# Patient Record
Sex: Male | Born: 1970 | Race: White | Hispanic: No | Marital: Married | Smoking: Current every day smoker
Health system: Southern US, Community
[De-identification: ages and names within clinical notes are randomized; demographics above are authoritative.]

---

## 2002-06-13 ENCOUNTER — Ambulatory Visit (HOSPITAL_COMMUNITY): Admission: RE | Admit: 2002-06-13 | Discharge: 2002-06-13 | Payer: Self-pay | Admitting: Internal Medicine

## 2019-04-04 ENCOUNTER — Other Ambulatory Visit (HOSPITAL_COMMUNITY): Payer: Self-pay | Admitting: Orthopedic Surgery

## 2019-04-04 ENCOUNTER — Other Ambulatory Visit: Payer: Self-pay | Admitting: Orthopedic Surgery

## 2019-04-04 DIAGNOSIS — G8929 Other chronic pain: Secondary | ICD-10-CM

## 2019-04-12 ENCOUNTER — Ambulatory Visit (HOSPITAL_COMMUNITY)
Admission: RE | Admit: 2019-04-12 | Discharge: 2019-04-12 | Disposition: A | Discharge status: Home / Self Care | Payer: No Typology Code available for payment source | Source: Ambulatory Visit | Attending: Orthopedic Surgery | Admitting: Orthopedic Surgery

## 2019-04-12 ENCOUNTER — Other Ambulatory Visit: Payer: Self-pay

## 2019-04-12 DIAGNOSIS — G8929 Other chronic pain: Secondary | ICD-10-CM | POA: Insufficient documentation

## 2019-04-12 DIAGNOSIS — M25512 Pain in left shoulder: Secondary | ICD-10-CM | POA: Insufficient documentation

## 2020-12-22 DIAGNOSIS — H524 Presbyopia: Secondary | ICD-10-CM | POA: Diagnosis not present

## 2021-01-04 IMAGING — MR MRI OF THE LEFT SHOULDER WITHOUT CONTRAST
5 series · 40 of 40 positions shown · non-contrast
Comparison: Radiographs 11/22/2018.

CLINICAL DATA: Chronic left shoulder pain for 3 years, especially
with overhead use. Question rotator cuff tear.

EXAM:
MRI OF THE LEFT SHOULDER WITHOUT CONTRAST
TECHNIQUE: Multiplanar, multisequence MR imaging of the shoulder was performed.
No intravenous contrast was administered.

[Series 9: PD fat-sat · axial · left · 3.0mm · 0.55mm/px · z∈[-171,-75]mm · 8 of 30 slices shown (1 of 2)]
[im 1/30]
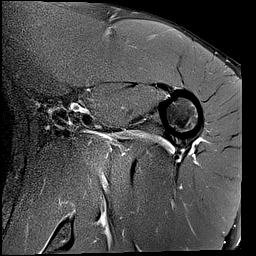
[im 5/30]
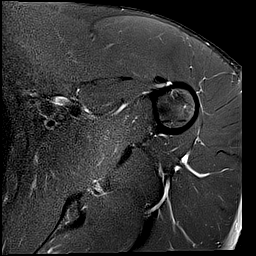
[im 9/30]
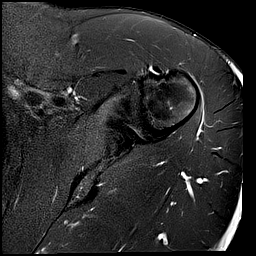
[im 13/30]
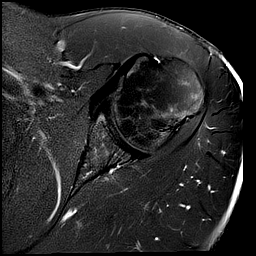
[im 17/30]
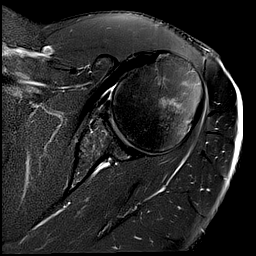
[im 21/30]
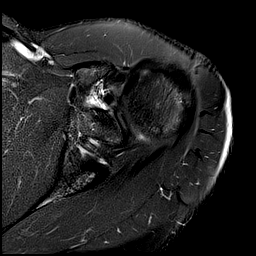
[im 25/30]
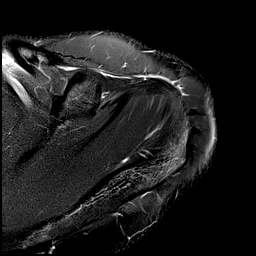
[im 30/30]
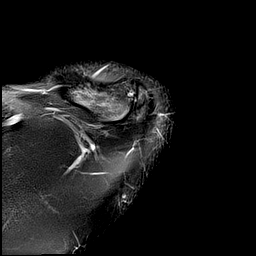

[Series 10: T2 fat-sat · oblique · left · 3.0mm · 0.46mm/px · 8 of 28 slices shown (1 of 2)]
[im 1/28]
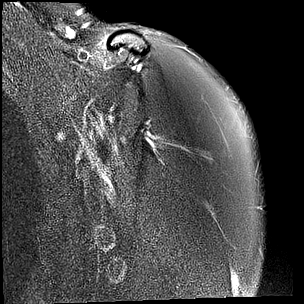
[im 4/28]
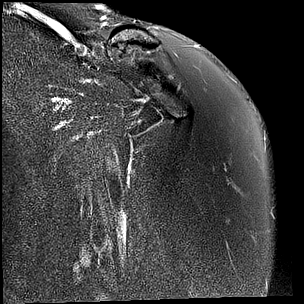
[im 8/28]
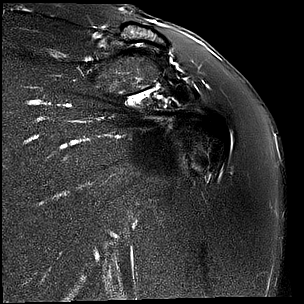
[im 12/28]
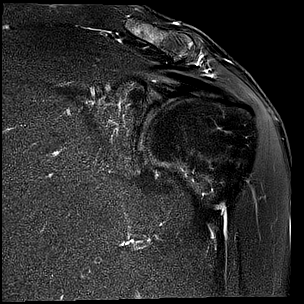
[im 16/28]
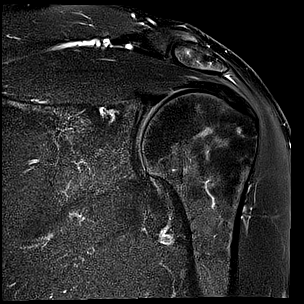
[im 20/28]
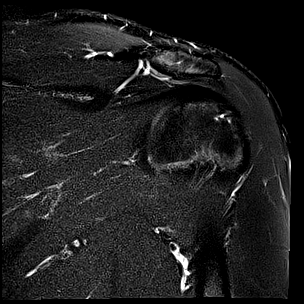
[im 24/28]
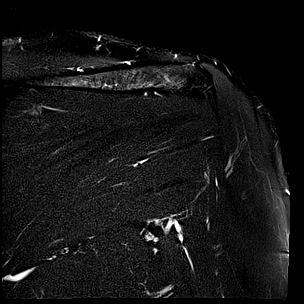
[im 28/28]
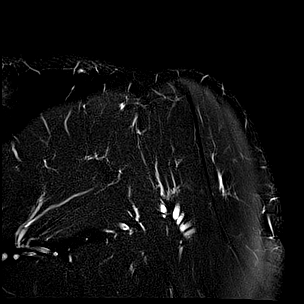

[Series 11: PD fat-sat · oblique · left · 3.0mm · 0.55mm/px · 8 of 28 slices shown (2 of 2)]
[im 1/28]
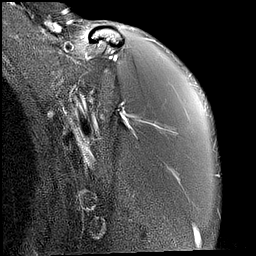
[im 4/28]
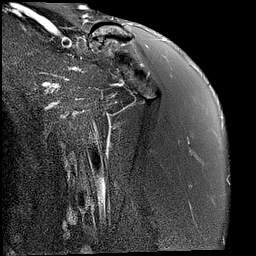
[im 8/28]
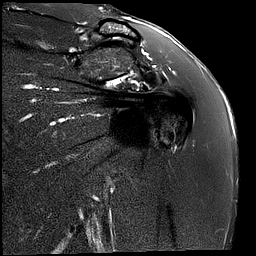
[im 12/28]
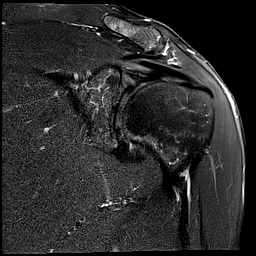
[im 16/28]
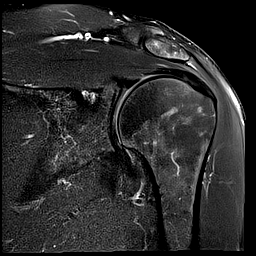
[im 20/28]
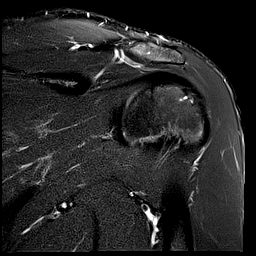
[im 24/28]
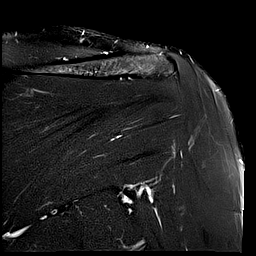
[im 28/28]
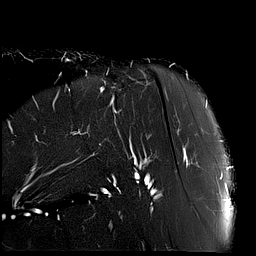

[Series 13: T1 · oblique · left · 3.0mm · 0.55mm/px · 8 of 30 slices shown]
[im 1/30]
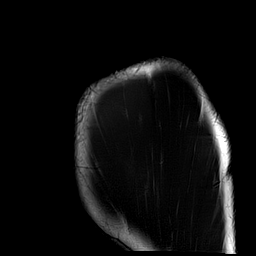
[im 5/30]
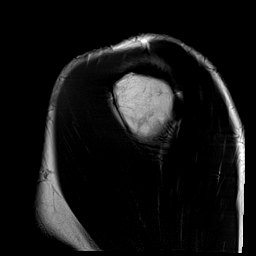
[im 9/30]
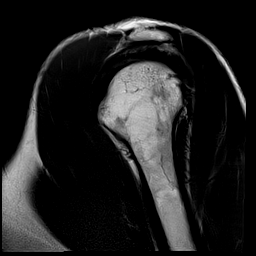
[im 13/30]
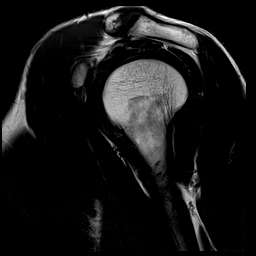
[im 17/30]
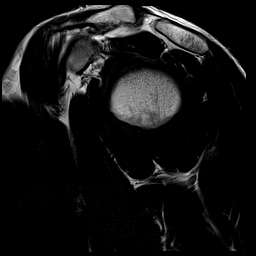
[im 21/30]
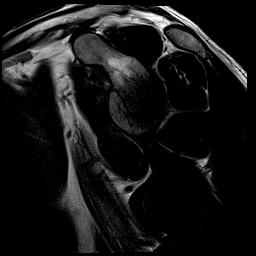
[im 25/30]
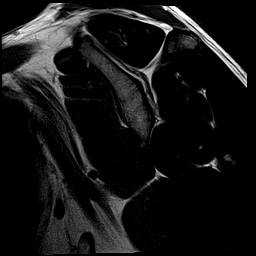
[im 30/30]
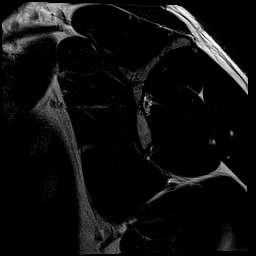

[Series 14: T2 fat-sat · oblique · left · 3.0mm · 0.44mm/px · 8 of 30 slices shown (2 of 2)]
[im 1/30]
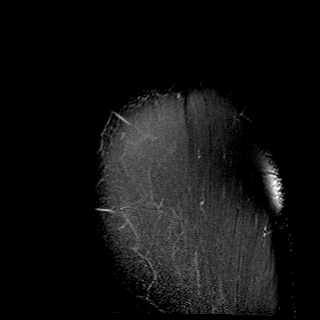
[im 5/30]
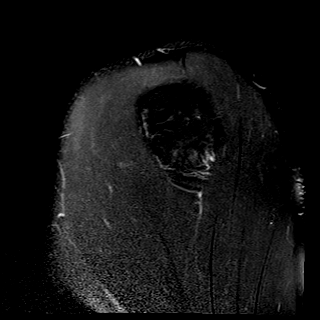
[im 9/30]
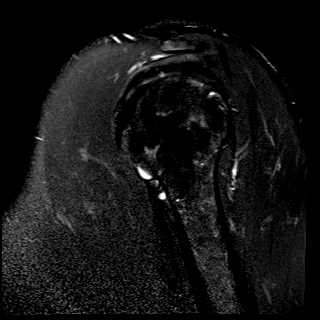
[im 13/30]
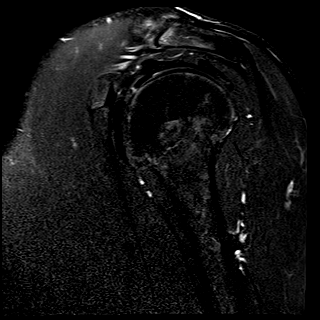
[im 17/30]
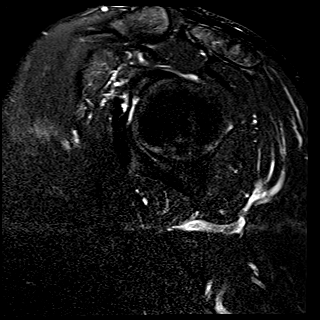
[im 21/30]
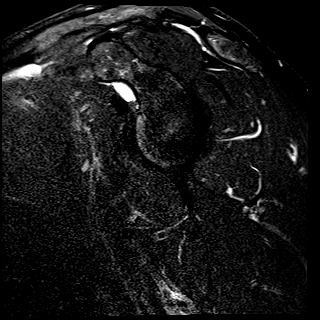
[im 25/30]
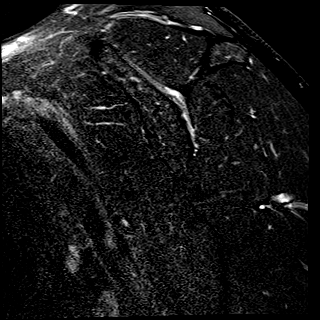
[im 30/30]
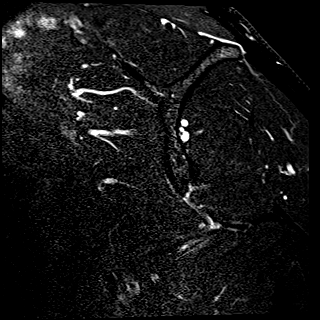

[40 of 40 positions shown; findings below may reference images not displayed]

FINDINGS: Rotator cuff: Mild supraspinatus tendinosis with minimal articular
surface irregularity anteriorly. No focal rotator cuff tear or
tendon retraction. The infraspinatus, subscapularis and teres minor
tendons appear normal.

Muscles:  No focal muscular atrophy or edema.

Biceps long head:  Intact and normally positioned.

Acromioclavicular Joint: The acromion is type 2. There are mild
acromioclavicular degenerative changes. No significant fluid is
present in the subacromial - subdeltoid bursa.

Glenohumeral Joint: No significant shoulder joint effusion or
glenohumeral arthropathy.

Labrum:  No evidence of labral tear or paralabral cyst.

Bones: No acute or significant extra-articular osseous findings.

Other: No significant soft tissue findings.
IMPRESSION: 1. Mild supraspinatus tendinosis with minimal articular surface
irregularity anteriorly. No focal rotator cuff tear, tendon
retraction or muscular atrophy.
2. The biceps tendon and labrum appear intact.
3. Mild acromioclavicular degenerative changes.

## 2021-04-13 ENCOUNTER — Ambulatory Visit: Payer: No Typology Code available for payment source | Admitting: Internal Medicine

## 2021-05-05 ENCOUNTER — Ambulatory Visit: Payer: Self-pay | Admitting: Internal Medicine

## 2021-06-08 ENCOUNTER — Ambulatory Visit (INDEPENDENT_AMBULATORY_CARE_PROVIDER_SITE_OTHER): Payer: Self-pay | Admitting: Internal Medicine

## 2021-06-08 ENCOUNTER — Other Ambulatory Visit: Payer: Self-pay

## 2021-06-08 ENCOUNTER — Encounter: Payer: Self-pay | Admitting: Internal Medicine

## 2021-06-08 VITALS — BP 130/80 | HR 98 | Temp 98.7°F | Resp 18 | Ht 70.0 in | Wt 179.1 lb

## 2021-06-08 DIAGNOSIS — Z114 Encounter for screening for human immunodeficiency virus [HIV]: Secondary | ICD-10-CM

## 2021-06-08 DIAGNOSIS — Z0000 Encounter for general adult medical examination without abnormal findings: Secondary | ICD-10-CM

## 2021-06-08 DIAGNOSIS — Z7689 Persons encountering health services in other specified circumstances: Secondary | ICD-10-CM

## 2021-06-08 DIAGNOSIS — Z122 Encounter for screening for malignant neoplasm of respiratory organs: Secondary | ICD-10-CM

## 2021-06-08 DIAGNOSIS — Z720 Tobacco use: Secondary | ICD-10-CM

## 2021-06-08 DIAGNOSIS — Z1159 Encounter for screening for other viral diseases: Secondary | ICD-10-CM

## 2021-06-08 DIAGNOSIS — Z23 Encounter for immunization: Secondary | ICD-10-CM

## 2021-06-08 DIAGNOSIS — Z1211 Encounter for screening for malignant neoplasm of colon: Secondary | ICD-10-CM

## 2021-06-08 DIAGNOSIS — R030 Elevated blood-pressure reading, without diagnosis of hypertension: Secondary | ICD-10-CM | POA: Insufficient documentation

## 2021-06-08 NOTE — Assessment & Plan Note (Signed)
Annual exam as documented. Counseling done  re healthy lifestyle involving commitment to 150 minutes exercise per week, heart healthy diet, and attaining healthy weight.The importance of adequate sleep also discussed. Changes in health habits are decided on by the patient with goals and time frames  set for achieving them. Immunization and cancer screening needs are specifically addressed at this visit. 

## 2021-06-08 NOTE — Assessment & Plan Note (Signed)
BP Readings from Last 1 Encounters:  06/08/21 130/80   Advised DASH diet and moderate exercise/walking, at least 150 mins/week

## 2021-06-08 NOTE — Progress Notes (Signed)
New Patient Office Visit  Subjective:  Patient ID: Philip Blake, male    DOB: 1970-11-15  Age: 50 y.o. MRN: [id number]  CC:  Chief Complaint  Patient presents with   New Patient (Initial Visit)    New patient just establishing care     HPI Philip Blake is a 50 year old male with PMH of tobacco abuse who presents for establishing care and annual physical.  He has been doing well overall. His BP was elevated initially during the visit, but improved later. Patient denies headache, dizziness, chest pain, dyspnea or palpitations.  He smokes 1 pack/day for about 30 years. He tried Nicotine patch in the past, but continued to smoke along with it.  He has had 1 dose of J&J vaccine, and denies to take any more COVID vaccine.  He received first dose of Shingrix vaccine.   History reviewed. No pertinent past medical history.  History reviewed. No pertinent surgical history.  History reviewed. No pertinent family history.  Social History   Socioeconomic History   Marital status: Married    Spouse name: Not on file   Number of children: Not on file   Years of education: Not on file   Highest education level: Not on file  Occupational History   Not on file  Tobacco Use   Smoking status: Every Day    Types: Cigarettes    Passive exposure: Current   Smokeless tobacco: Never  Substance and Sexual Activity   Alcohol use: Yes    Alcohol/week: 6.0 standard drinks    Types: 6 Cans of beer per week    Comment: 6 per day   Drug use: Never   Sexual activity: Not on file  Other Topics Concern   Not on file  Social History Narrative   Not on file   Social Determinants of Health   Financial Resource Strain: Not on file  Food Insecurity: Not on file  Transportation Needs: Not on file  Physical Activity: Not on file  Stress: Not on file  Social Connections: Not on file  Intimate Partner Violence: Not on file    ROS Review of Systems  Constitutional:  Negative for chills and  fever.  HENT:  Negative for congestion and sore throat.   Eyes:  Negative for pain and discharge.  Respiratory:  Negative for cough and shortness of breath.   Cardiovascular:  Negative for chest pain and palpitations.  Gastrointestinal:  Negative for constipation, diarrhea, nausea and vomiting.  Endocrine: Negative for polydipsia and polyuria.  Genitourinary:  Negative for dysuria and hematuria.  Musculoskeletal:  Negative for neck pain and neck stiffness.  Skin:  Negative for rash.  Neurological:  Negative for dizziness, weakness, numbness and headaches.  Psychiatric/Behavioral:  Negative for agitation and behavioral problems.    Objective:   Today's Vitals: BP 130/80 (BP Location: Left Arm, Cuff Size: Normal)   Pulse 98   Temp 98.7 F (37.1 C) (Oral)   Resp 18   Ht _0  (1.778 m)   Wt 179 lb 1.9 oz (81.2 kg)   SpO2 98%   BMI 25.70 kg/m   Physical Exam Vitals reviewed.  Constitutional:      General: He is not in acute distress.    Appearance: He is not diaphoretic.  HENT:     Head: Normocephalic and atraumatic.     Nose: Nose normal.     Mouth/Throat:     Mouth: Mucous membranes are moist.  Eyes:     General:  No scleral icterus.    Extraocular Movements: Extraocular movements intact.  Cardiovascular:     Rate and Rhythm: Normal rate and regular rhythm.     Pulses: Normal pulses.     Heart sounds: Normal heart sounds. No murmur heard. Pulmonary:     Breath sounds: Normal breath sounds. No wheezing or rales.  Abdominal:     Palpations: Abdomen is soft.     Tenderness: There is no abdominal tenderness.  Musculoskeletal:     Cervical back: Neck supple. No tenderness.     Right lower leg: No edema.     Left lower leg: No edema.  Skin:    General: Skin is warm.     Findings: No rash.  Neurological:     General: No focal deficit present.     Mental Status: He is alert and oriented to person, place, and time.     Sensory: No sensory deficit.     Motor: No  weakness.  Psychiatric:        Mood and Affect: Mood normal.        Behavior: Behavior normal.    Assessment & Plan:   Problem List Items Addressed This Visit       Other   Annual physical exam - Primary    Annual exam as documented. Counseling done  re healthy lifestyle involving commitment to 150 minutes exercise per week, heart healthy diet, and attaining healthy weight.The importance of adequate sleep also discussed. Changes in health habits are decided on by the patient with goals and time frames  set for achieving them. Immunization and cancer screening needs are specifically addressed at this visit.      Relevant Orders   CBC with Differential/Platelet   CMP14+EGFR   Lipid panel   HgB A1c   TSH   Vitamin D (25 hydroxy)   Tobacco abuse    Smokes about 1 pack/day  Asked about quitting: confirms that he currently smokes cigarettes Advise to quit smoking: Educated about QUITTING to reduce the risk of cancer, cardio and cerebrovascular disease. Assess willingness: Unwilling to quit at this time, but is working on cutting back. Assist with counseling and pharmacotherapy: Counseled for 5 minutes and literature provided. Advised to use Nicotine patch. Arrange for follow up: follow up in 3 months and continue to offer help.      Prehypertension    BP Readings from Last 1 Encounters:  06/08/21 130/80  Advised DASH diet and moderate exercise/walking, at least 150 mins/week       Other Visit Diagnoses     Screen for colon cancer       Relevant Orders   Ambulatory referral to Gastroenterology   Screening for lung cancer       Relevant Orders   CT CHEST LUNG CA SCREEN LOW DOSE W/O CM   Need for hepatitis C screening test       Relevant Orders   Hepatitis C Antibody   Encounter for screening for HIV       Relevant Orders   HIV antibody (with reflex)   Need for viral immunization       Relevant Orders   Varicella-zoster vaccine IM (Shingrix) (Completed)        No outpatient encounter medications on file as of 06/08/2021.   No facility-administered encounter medications on file as of 06/08/2021.    Follow-up: Return in about 4 months (around 10/08/2021) for Prehypertension and second dose of Shingrix.   Lindell Spar, MD

## 2021-06-08 NOTE — Assessment & Plan Note (Signed)
Smokes about 1 pack/day  Asked about quitting: confirms that he currently smokes cigarettes Advise to quit smoking: Educated about QUITTING to reduce the risk of cancer, cardio and cerebrovascular disease. Assess willingness: Unwilling to quit at this time, but is working on cutting back. Assist with counseling and pharmacotherapy: Counseled for 5 minutes and literature provided. Advised to use Nicotine patch. Arrange for follow up: follow up in 3 months and continue to offer help.

## 2021-06-08 NOTE — Patient Instructions (Addendum)
Please get fasting blood tests in a week.  Please continue to follow heart healthy diet and perform moderate exercise/walking at least 150 mins/week.  You are being referred to GI for screening colonoscopy.  You are being scheduled for low dose CT chest.

## 2021-06-09 ENCOUNTER — Encounter (INDEPENDENT_AMBULATORY_CARE_PROVIDER_SITE_OTHER): Payer: Self-pay | Admitting: *Deleted

## 2021-06-18 DIAGNOSIS — Z20822 Contact with and (suspected) exposure to covid-19: Secondary | ICD-10-CM | POA: Diagnosis not present

## 2021-07-09 ENCOUNTER — Ambulatory Visit (HOSPITAL_COMMUNITY): Admission: RE | Admit: 2021-07-09 | Payer: 59 | Source: Ambulatory Visit

## 2021-07-23 DIAGNOSIS — Z0000 Encounter for general adult medical examination without abnormal findings: Secondary | ICD-10-CM | POA: Diagnosis not present

## 2021-07-23 DIAGNOSIS — Z7689 Persons encountering health services in other specified circumstances: Secondary | ICD-10-CM | POA: Diagnosis not present

## 2021-07-23 DIAGNOSIS — Z1159 Encounter for screening for other viral diseases: Secondary | ICD-10-CM | POA: Diagnosis not present

## 2021-07-23 DIAGNOSIS — Z114 Encounter for screening for human immunodeficiency virus [HIV]: Secondary | ICD-10-CM | POA: Diagnosis not present

## 2021-07-24 LAB — CBC WITH DIFFERENTIAL/PLATELET
Basophils Absolute: 0.1 10*3/uL (ref 0.0–0.2)
Basos: 1 %
EOS (ABSOLUTE): 0.2 10*3/uL (ref 0.0–0.4)
Eos: 2 %
Hematocrit: 42.8 % (ref 37.5–51.0)
Hemoglobin: 14.4 g/dL (ref 13.0–17.7)
Immature Grans (Abs): 0 10*3/uL (ref 0.0–0.1)
Immature Granulocytes: 0 %
Lymphocytes Absolute: 2.5 10*3/uL (ref 0.7–3.1)
Lymphs: 27 %
MCH: 30.8 pg (ref 26.6–33.0)
MCHC: 33.6 g/dL (ref 31.5–35.7)
MCV: 92 fL (ref 79–97)
Monocytes Absolute: 0.7 10*3/uL (ref 0.1–0.9)
Monocytes: 8 %
Neutrophils Absolute: 5.6 10*3/uL (ref 1.4–7.0)
Neutrophils: 62 %
Platelets: 285 10*3/uL (ref 150–450)
RBC: 4.68 x10E6/uL (ref 4.14–5.80)
RDW: 12.1 % (ref 11.6–15.4)
WBC: 9 10*3/uL (ref 3.4–10.8)

## 2021-07-24 LAB — CMP14+EGFR
ALT: 15 IU/L (ref 0–44)
AST: 20 IU/L (ref 0–40)
Albumin/Globulin Ratio: 2.6 — ABNORMAL HIGH (ref 1.2–2.2)
Albumin: 4.6 g/dL (ref 3.8–4.9)
Alkaline Phosphatase: 51 IU/L (ref 44–121)
BUN/Creatinine Ratio: 15 (ref 9–20)
BUN: 14 mg/dL (ref 6–24)
Bilirubin Total: 0.5 mg/dL (ref 0.0–1.2)
CO2: 22 mmol/L (ref 20–29)
Calcium: 9.6 mg/dL (ref 8.7–10.2)
Chloride: 104 mmol/L (ref 96–106)
Creatinine, Ser: 0.96 mg/dL (ref 0.76–1.27)
Globulin, Total: 1.8 g/dL (ref 1.5–4.5)
Glucose: 86 mg/dL (ref 70–99)
Potassium: 4.4 mmol/L (ref 3.5–5.2)
Sodium: 142 mmol/L (ref 134–144)
Total Protein: 6.4 g/dL (ref 6.0–8.5)
eGFR: 95 mL/min/{1.73_m2} (ref >59)

## 2021-07-24 LAB — HEPATITIS C ANTIBODY: Hep C Virus Ab: 0.2 s/co ratio (ref 0.0–0.9)

## 2021-07-24 LAB — LIPID PANEL
Chol/HDL Ratio: 1.7 ratio (ref 0.0–5.0)
Cholesterol, Total: 166 mg/dL (ref 100–199)
HDL: 97 mg/dL (ref >39)
LDL Chol Calc (NIH): 61 mg/dL (ref 0–99)
Triglycerides: 31 mg/dL (ref 0–149)
VLDL Cholesterol Cal: 8 mg/dL (ref 5–40)

## 2021-07-24 LAB — HEMOGLOBIN A1C
Est. average glucose Bld gHb Est-mCnc: 94 mg/dL
Hgb A1c MFr Bld: 4.9 % (ref 4.8–5.6)

## 2021-07-24 LAB — VITAMIN D 25 HYDROXY (VIT D DEFICIENCY, FRACTURES): Vit D, 25-Hydroxy: 97.5 ng/mL (ref 30.0–100.0)

## 2021-07-24 LAB — TSH: TSH: 0.954 u[IU]/mL (ref 0.450–4.500)

## 2021-07-24 LAB — HIV ANTIBODY (ROUTINE TESTING W REFLEX): HIV Screen 4th Generation wRfx: NONREACTIVE

## 2021-07-31 ENCOUNTER — Encounter: Payer: Self-pay | Admitting: *Deleted

## 2021-10-08 ENCOUNTER — Ambulatory Visit: Payer: Self-pay | Admitting: Internal Medicine

## 2022-02-22 ENCOUNTER — Encounter: Payer: Self-pay | Admitting: Internal Medicine

## 2022-02-22 ENCOUNTER — Ambulatory Visit: Payer: 59 | Admitting: Internal Medicine

## 2022-02-22 VITALS — BP 118/82 | HR 98 | Resp 18 | Ht 70.0 in | Wt 186.6 lb

## 2022-02-22 DIAGNOSIS — L723 Sebaceous cyst: Secondary | ICD-10-CM

## 2022-02-22 DIAGNOSIS — Z1211 Encounter for screening for malignant neoplasm of colon: Secondary | ICD-10-CM

## 2022-02-22 DIAGNOSIS — L821 Other seborrheic keratosis: Secondary | ICD-10-CM

## 2022-02-22 NOTE — Patient Instructions (Signed)
Please apply sunscreen during sun exposure. ?

## 2022-02-22 NOTE — Progress Notes (Signed)
? ?Acute Office Visit ? ?Subjective:  ? ? Patient ID: Philip Blake, male    DOB: [birth year], 51 y.o.   MRN: [id number] ? ?Chief Complaint  ?Patient presents with  ? Follow-up  ?  Follow up pt has spot on left side of face would like looked at has been there for about a month doesn't hurt or itch   ? ? ?HPI ?Patient is in today for c/o a skin spot on left side of his face, which he noticed about 2 months ago.  Denies any recent change in size or shape.  Denies any injury or insect bite.  Denies any itching or irritation in the area. ? ?History reviewed. No pertinent past medical history. ? ?History reviewed. No pertinent surgical history. ? ?History reviewed. No pertinent family history. ? ?Social History  ? ?Socioeconomic History  ? Marital status: Married  ?  Spouse name: Not on file  ? Number of children: Not on file  ? Years of education: Not on file  ? Highest education level: Not on file  ?Occupational History  ? Not on file  ?Tobacco Use  ? Smoking status: Every Day  ?  Types: Cigarettes  ?  Passive exposure: Current  ? Smokeless tobacco: Never  ?Substance and Sexual Activity  ? Alcohol use: Yes  ?  Alcohol/week: 6.0 standard drinks  ?  Types: 6 Cans of beer per week  ?  Comment: 6 per day  ? Drug use: Never  ? Sexual activity: Not on file  ?Other Topics Concern  ? Not on file  ?Social History Narrative  ? Not on file  ? ?Social Determinants of Health  ? ?Financial Resource Strain: Not on file  ?Food Insecurity: Not on file  ?Transportation Needs: Not on file  ?Physical Activity: Not on file  ?Stress: Not on file  ?Social Connections: Not on file  ?Intimate Partner Violence: Not on file  ? ? ?No outpatient medications prior to visit.  ? ?No facility-administered medications prior to visit.  ? ? ?Allergies  ?Allergen Reactions  ? Sulfa Antibiotics   ?  Makes him dizzy   ? ? ?Review of Systems  ?Constitutional:  Negative for chills and fever.  ?HENT:  Negative for congestion and sore throat.   ?Eyes:  Negative  for pain and discharge.  ?Respiratory:  Negative for cough and shortness of breath.   ?Cardiovascular:  Negative for chest pain and palpitations.  ?Gastrointestinal:  Negative for constipation, diarrhea, nausea and vomiting.  ?Endocrine: Negative for polydipsia and polyuria.  ?Genitourinary:  Negative for dysuria and hematuria.  ?Musculoskeletal:  Negative for neck pain and neck stiffness.  ?Skin:   ?     Bump over left side of face  ?Neurological:  Negative for dizziness, weakness, numbness and headaches.  ?Psychiatric/Behavioral:  Negative for agitation and behavioral problems.   ? ?   ?Objective:  ?  ?Physical Exam ?Constitutional:   ?   General: He is not in acute distress. ?   Appearance: He is not diaphoretic.  ?Cardiovascular:  ?   Pulses: Normal pulses.  ?   Heart sounds: Normal heart sounds. No murmur heard. ?Pulmonary:  ?   Breath sounds: Normal breath sounds. No wheezing or rales.  ?Skin: ?   Findings: Lesion (Brownish patch near left ear - likely seborrheic keratosis) present.  ?   Comments: Cystlike structure near left eye  ?Neurological:  ?   Mental Status: He is alert.  ? ? ?BP 118/82 (BP Location:  Right Arm, Patient Position: Sitting, Cuff Size: Normal)   Pulse 98   Resp 18   Ht 5\' 10"  (1.778 m)   Wt 186 lb 9.6 oz (84.6 kg)   SpO2 98%   BMI 26.77 kg/m?  ?Wt Readings from Last 3 Encounters:  ?02/22/22 186 lb 9.6 oz (84.6 kg)  ?06/08/21 179 lb 1.9 oz (81.2 kg)  ? ? ? ?   ?Assessment & Plan:  ? ?Problem List Items Addressed This Visit   ? ?Visit Diagnoses   ? ? Sebaceous cyst    -  Primary ?Appears to be sebaceous cyst near left eye -benign, reassured ? ?  ? ? Special screening for malignant neoplasms, colon      ? Relevant Orders  ? Cologuard  ? Seborrheic keratosis     ?Has brownish patch near the left ear over sideline of hair, likely seborrheic keratosis ?Offered dermatology referral -he prefers to wait for now  ? ?  ? ? ? ?No orders of the defined types were placed in this  encounter. ? ? ? ?Lindell Spar, MD ?

## 2022-02-22 NOTE — Addendum Note (Signed)
Addended byTrena Platt on: 02/22/2022 05:22 PM ? ? Modules accepted: Level of Service ? ?

## 2024-01-31 ENCOUNTER — Other Ambulatory Visit (INDEPENDENT_AMBULATORY_CARE_PROVIDER_SITE_OTHER): Payer: Self-pay

## 2024-01-31 ENCOUNTER — Ambulatory Visit: Admitting: Orthopedic Surgery

## 2024-01-31 VITALS — BP 163/98 | HR 93 | Ht 70.0 in | Wt 187.0 lb

## 2024-01-31 DIAGNOSIS — G5632 Lesion of radial nerve, left upper limb: Secondary | ICD-10-CM | POA: Diagnosis not present

## 2024-01-31 DIAGNOSIS — M25522 Pain in left elbow: Secondary | ICD-10-CM | POA: Diagnosis not present

## 2024-01-31 NOTE — Progress Notes (Unsigned)
 New Patient Visit  Assessment: JAHDIEL KROL is a 53 y.o. male with the following: 1. 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  Subjective:  Chief Complaint  Patient presents with   Elbow Pain    L arm pain inside of the arms for approx 2 yrs.     History of Present Illness: LEODAN BOLYARD is a 53 y.o. male who {Presentation:27320} for evaluation of    Review of Systems: No fevers or chills*** No numbness or tingling No chest pain No shortness of breath No bowel or bladder dysfunction No GI distress No headaches   Medical History:  No past medical history on file.  No past surgical history on file.  No family history on file. Social History   Tobacco Use   Smoking status: Every Day    Types: Cigarettes    Passive exposure: Current   Smokeless tobacco: Never  Substance Use Topics   Alcohol use: Yes    Alcohol/week: 6.0 standard drinks of alcohol    Types: 6 Cans of beer per week    Comment: 6 per day   Drug use: Never    Allergies  Allergen Reactions   Sulfa Antibiotics     Makes him dizzy     No outpatient medications have been marked as taking for the 01/31/24 encounter (Office Visit) with Oliver Barre, MD.    Objective: BP (!) 163/98   Pulse 93   Ht 5\' 10"  (1.778 m)   Wt 187 lb (84.8 kg)   BMI 26.83 kg/m   Physical Exam:  General: {General PE Findings:25791} Gait: {Gait:25792}    IMAGING: {XR Reviewed:24899}   New Medications:  No orders of the defined types were placed in this encounter.     Oliver Barre, MD  01/31/2024 4:16 PM

## 2024-01-31 NOTE — Patient Instructions (Addendum)
 We will discuss injections and update you regarding the referral

## 2024-02-01 ENCOUNTER — Encounter: Payer: Self-pay | Admitting: Orthopedic Surgery

## 2024-02-01 NOTE — Addendum Note (Signed)
 Addended by: Baird Kay on: 02/01/2024 08:30 AM   Modules accepted: Orders

## 2024-02-06 ENCOUNTER — Encounter: Payer: Self-pay | Admitting: Orthopedic Surgery

## 2024-02-24 ENCOUNTER — Ambulatory Visit: Admitting: Sports Medicine

## 2024-02-24 ENCOUNTER — Encounter: Payer: Self-pay | Admitting: Sports Medicine

## 2024-02-24 ENCOUNTER — Other Ambulatory Visit: Payer: Self-pay

## 2024-02-24 DIAGNOSIS — M79632 Pain in left forearm: Secondary | ICD-10-CM | POA: Diagnosis not present

## 2024-02-24 DIAGNOSIS — G5632 Lesion of radial nerve, left upper limb: Secondary | ICD-10-CM

## 2024-02-24 NOTE — Progress Notes (Signed)
 Patient says that he has had pain in the left arm for two years that has gotten progressively worse. He is right-handed, but does carry a lot in his left hand, especially for work. He says that it does not bother him at rest, but he is unable to do bicep curls due to pain. He says that even if he holds his phone up to his ear for awhile he will have pain when he extends his elbow. Patient denies any shooting pain, numbness, or tingling. He says his pain is pretty local; he never feels it elsewhere in the arm, or in the shoulder or wrist. He is not taking any medication or doing anything else to treat his pain. He says that he tried a strap but that made his pain worse.

## 2024-02-24 NOTE — Progress Notes (Signed)
 Philip Blake - 53 y.o. male MRN [id number]  Date of birth: 07/05/1971  Office Visit Note: Visit Date: 02/24/2024 PCP: Meldon Sport, MD Referred by: Tonita Frater, MD  Subjective: Chief Complaint  Patient presents with   Left Elbow - Pain   HPI: Philip Blake is a pleasant 53 y.o. male who presents today for chronic left forearm pain.  He has had pain deep within the proximal forearm for approximately 2 years.  His pain continues to worsen.  This is affecting his daily activities.  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  He reports his toolbox is about 40 pounds and he carries this with his left hand.  He denies any weakness in the forearm or wrist, but has a deep ache within the proximal forearm.  No numbness or tingling.  He has not had any treatment or surgical intervention for this.  Pertinent ROS were reviewed with the patient and found to be negative unless otherwise specified above in HPI.   Assessment & Plan: Visit Diagnoses:  1. 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 Left forearm pain    Plan: Impression is signs and symptoms as well as ultrasound findings indicative of radial tunnel syndrome with compression and flattening of the deep branch of the radial nerve.  Through shared decision making, we did proceed with an US -guided hydrodissection peripheral nerve injection around the deep branch of the radial nerve in this location near the entrapment around arcade of [place].  I would like him to avoid gripping and lifting activities for the left upper extremity for the next 48 to 72 hours.  Starting on Monday he may return to activity as tolerated.  I would like him to see over the next 2 weeks to what degree his symptoms have improved.  He will send a message or call me in 2 weeks and give me a percentage of his pain from 0-100% following relief from this injection.  We discussed  postinjection protocol.  He may use ice/heat as well as over-the-counter anti-inflammatories as needed.  Depending on the improvement, we could consider additional hydrodissection nerve injection.  Surgical release is always an option as well.  Follow-up: Return for message or call me with update x 2 weeks.   Meds & Orders: No orders of the defined types were placed in this encounter.   Orders Placed This Encounter  Procedures   US  Extrem Up Left Ltd     Procedures:  US -guided Radial Tunnel injection, Left Arm After discussion on risks/benefits/indications, informed verbal consent was obtained. A timeout was then performed. The patient was positioned in the supine position with the arm laying flat on the examination table in a modified supinated position with the thumb facing upward.  The elbow and forearm was prepped with ChloraPrep and multiple alcohol swabs.  Utilizing ultrasound guidance with the probe in a short axis plane, the deep branch of the radial nerve was visualized at the area of restriction with the overlying supinator and exiting the arcade of [place]. Using ultrasound guidance, a 25-gauge, 1.5 inch needle was inserted into this area for anesthetic purposes with 4 cc of lidocaine 1% only. After appropriate analgesia, a separate 22-gauge 1.5 inch needle was inserted under ultrasound guidance using an in-plane, radial to ulnar approach with visualization of the needle tip going above and below the branch of the  radial nerve. A mixture of 2:2:3:2:1 cc's of lidocaine:bupivicaine:D50W:NS:methylprednisolone was then subsequently injected with hydrodissection technique in a 360-degree fashion around the deep branch of the radial nerve. Patient tolerated procedure well without immediate complication.  A Band-Aid was applied.      Clinical History: No specialty comments available.  He reports that he has been smoking cigarettes. He has been exposed to tobacco smoke. He has never used  smokeless tobacco. No results for input(s): "HGBA1C", "LABURIC" in the last 8760 hours.  Objective:    Physical Exam  Gen: Well-appearing, in no acute distress; non-toxic CV: Well-perfused. Warm.  Resp: Breathing unlabored on room air; no wheezing. Psych: Fluid speech in conversation; appropriate affect; normal thought process  Ortho Exam - Left elbow/forearm: There is full range of motion about the elbow joint with flexion and tension.  No elbow joint effusion.  There is pain with deep palpation over the dorsal proximal forearm musculature near the overlapping supinator.  There is no weakness with resisted forearm extension or grip strength.  Imaging: US  Extrem Up Left Ltd Result Date: 02/24/2024 Limited musculoskeletal ultrasound of the left elbow/arm was performed today.  The elbow joint was visualized without effusion, no abnormality of the coronoid.  The radial nerve was identified both in a static and dynamic view from the level of the flexor elbow crease to the mid forearm.  The radial nerve was followed as it split both into the deep and superficial branches.  The deep branch was followed dynamically as it coursed through the arcade of [place] with the overlying supinator muscle identified.  Active and just proximal to this location the nerve is well-circumscribed but scanning distally there is compression with flattening of the deep branch of the radial nerve in this location, consistent with findings of radial tunnel syndrome. *Procedurally successful radial tunnel injection with Hydro dissection injection surrounding the deep branch of the radial nerve        Previous elbow XR 01/31/24:  Narrative & Impression  X-rays left elbow were obtained in clinic today.  No acute injuries noted.  No dislocation.  No degenerative changes.  No evidence of a chronic injury.  No reactive bone formation.  No bony lesions.   Impression: Negative left elbow x-ray.   Past  Medical/Family/Surgical/Social History: Medications & Allergies reviewed per EMR, new medications updated. Patient Active Problem List   Diagnosis Date Noted   Annual physical exam 06/08/2021   Tobacco abuse 06/08/2021   Prehypertension 06/08/2021   No past medical history on file. No family history on file. No past surgical history on file. Social History   Occupational History   Not on file  Tobacco Use   Smoking status: Every Day    Types: Cigarettes    Passive exposure: Current   Smokeless tobacco: Never  Substance and Sexual Activity   Alcohol use: Yes    Alcohol/week: 6.0 standard drinks of alcohol    Types: 6 Cans of beer per week    Comment: 6 per day   Drug use: Never   Sexual activity: Not on file

## 2024-03-10 ENCOUNTER — Encounter: Payer: Self-pay | Admitting: Sports Medicine

## 2024-04-13 ENCOUNTER — Ambulatory Visit: Admitting: Sports Medicine

## 2024-04-20 ENCOUNTER — Ambulatory Visit: Admitting: Sports Medicine

## 2024-05-03 ENCOUNTER — Encounter: Payer: Self-pay | Admitting: Sports Medicine

## 2024-05-03 ENCOUNTER — Ambulatory Visit: Admitting: Sports Medicine

## 2024-05-03 ENCOUNTER — Other Ambulatory Visit: Payer: Self-pay

## 2024-05-03 DIAGNOSIS — G5632 Lesion of radial nerve, left upper limb: Secondary | ICD-10-CM | POA: Diagnosis not present

## 2024-05-03 DIAGNOSIS — M79632 Pain in left forearm: Secondary | ICD-10-CM | POA: Diagnosis not present

## 2024-05-03 NOTE — Progress Notes (Signed)
 Philip Blake - 53 y.o. male MRN [id number]  Date of birth: 17-Aug-1971  Office Visit Note: Visit Date: 05/03/2024 PCP: Tobie Suzzane POUR, MD Referred by: Tobie Suzzane POUR, MD  Subjective: Chief Complaint  Patient presents with   Left Elbow - Pain   HPI: Philip Blake is a pleasant 53 y.o. male who presents today for follow-up of chronic left elbow/forearm pain with evidence of radial tunnel syndrome.  He saw me back on 02/24/2024 and we did ultrasound the surrounding area identifying the deep branch of the radial nerve near the radial tunnel.  We did perform a Hydro dissection injection of the deep branch of the radial nerve which gave him about 40% improvement of his symptoms.  Since that time he has continued to improve maybe about 60% or so but is still having some pain deep within the forearm.  He has been able to flex the bicep and use the arm much more than in the past.  Pertinent ROS were reviewed with the patient and found to be negative unless otherwise specified above in HPI.   Assessment & Plan: Visit Diagnoses:  1. 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 Left forearm pain    Plan: Impression is improving but persistent radial tunnel syndrome of the left upper extremity with forearm pain.  He did receive at least 40% improvement from previous hydrodissection injection of the radial tunnel over 2 months ago and has had continued improvement with both pain reduction and range of motion following this.  Through shared decision making, we did proceed with 1 additional ultrasound-guided hydrodissection peripheral nerve injection around the deep branch of the radial nerve in this location near the entrapment around arcade of [place].  I would like him to avoid gripping, repetitive upper extremity activities and lifting activities of the left upper extremity for the next 2 to 3 days.  Following this he may return to work but would still avoid heavy gripping activities of the forearm  for the next week. He may use ice or over-the-counter anti-inflammatories only as needed.  Discussed with [name] I would like to see over the next 3-4 weeks his degree of improvement.  If he is not having further reduction of his pain, he may benefit from seeing our hand/arm OT, [name].  He will let me know in 1 month how he is doing. Otherwise f/u PRN.  Follow-up: Return if symptoms worsen or fail to improve.   Meds & Orders: No orders of the defined types were placed in this encounter.   Orders Placed This Encounter  Procedures   US  Guided Needle Placement - No Linked Charges     Procedures:  US -guided Radial Tunnel injection, Left Arm After discussion on risks/benefits/indications, informed verbal consent was obtained. A timeout was then performed. The patient was positioned in the supine position with the arm laying flat on the examination table in a modified supinated position with the thumb facing upward.  The elbow and forearm was prepped with ChloraPrep and multiple alcohol swabs.  Utilizing ultrasound guidance with the probe in a short axis plane, the deep branch of the radial nerve was visualized at the area of restriction with the overlying supinator and exiting the arcade of [place]. Using ultrasound guidance, a 25-gauge, 1.5 inch needle was inserted into this area for anesthetic purposes with 4 cc of lidocaine 1% only. After appropriate analgesia, a separate 22-gauge 1.5 inch needle was inserted under ultrasound guidance using an in-plane,  radial to ulnar approach with visualization of the needle tip going above and below the branch of the radial nerve. A mixture of 2:2:3:2:1 cc's of lidocaine:bupivicaine:D50W:NS:celestone was then subsequently injected with hydrodissection technique in a 360-degree fashion around the deep branch of the radial nerve. Patient tolerated procedure well without immediate complication.  A Band-Aid was applied.        Clinical History: No specialty  comments available.  He reports that he has been smoking cigarettes. He has been exposed to tobacco smoke. He has never used smokeless tobacco. No results for input(s): HGBA1C, LABURIC in the last 8760 hours.  Objective:    Physical Exam  Gen: Well-appearing, in no acute distress; non-toxic CV: Well-perfused. Warm.  Resp: Breathing unlabored on room air; no wheezing. Psych: Fluid speech in conversation; appropriate affect; normal thought process  Ortho Exam - Left elbow: No redness swelling or effusion.  Full range of motion with flexion and extension at the elbow joint.  Positive TTP with palpation and firm Tinel's testing right over the radial tunnel region near [place] [place].  There is well-defined muscle bulk of the forearm with no weakness of the forearm or wrist.  Imaging:    Past Medical/Family/Surgical/Social History: Medications & Allergies reviewed per EMR, new medications updated. Patient Active Problem List   Diagnosis Date Noted   Annual physical exam 06/08/2021   Tobacco abuse 06/08/2021   Prehypertension 06/08/2021   History reviewed. No pertinent past medical history. History reviewed. No pertinent family history. History reviewed. No pertinent surgical history. Social History   Occupational History   Not on file  Tobacco Use   Smoking status: Every Day    Types: Cigarettes    Passive exposure: Current   Smokeless tobacco: Never  Substance and Sexual Activity   Alcohol use: Yes    Alcohol/week: 6.0 standard drinks of alcohol    Types: 6 Cans of beer per week    Comment: 6 per day   Drug use: Never   Sexual activity: Not on file

## 2024-05-03 NOTE — Progress Notes (Signed)
 Patient says that he does feel a bit better. He says that initially he felt about 40% improved, but does think he has noticed improvements in the time since then. He has been able to flex his bicep and use the arm more than he could before. He is still having discomfort in the same location as he was before.

## 2024-08-27 ENCOUNTER — Encounter: Payer: Self-pay | Admitting: Radiology

## 2024-10-09 ENCOUNTER — Ambulatory Visit
Admission: EM | Admit: 2024-10-09 | Discharge: 2024-10-09 | Disposition: A | Discharge status: Home / Self Care | Attending: Nurse Practitioner | Admitting: Nurse Practitioner

## 2024-10-09 DIAGNOSIS — J069 Acute upper respiratory infection, unspecified: Secondary | ICD-10-CM

## 2024-10-09 LAB — POC COVID19/FLU A&B COMBO
Covid Antigen, POC: NEGATIVE
Influenza A Antigen, POC: NEGATIVE
Influenza B Antigen, POC: NEGATIVE

## 2024-10-09 MED ORDER — PROMETHAZINE-DM 6.25-15 MG/5ML PO SYRP: 5.0000 mL | ORAL_SOLUTION | Freq: Four times a day (QID) | ORAL | 0 refills | Status: AC | PRN

## 2024-10-09 NOTE — Discharge Instructions (Signed)
 The COVID/flu test was negative. Take medication as prescribed. As discussed, you may find it helpful to begin an antihistamine such as Claritin, Allegra, or Zyrtec daily. Increase fluids and allow for plenty of rest. You may take over-the-counter Tylenol as needed for pain, fever, or general discomfort. Recommend normal saline nasal spray throughout the day for nasal congestion or runny nose. For the cough, recommend the use of a humidifier in your bedroom at nighttime during sleep and sleeping elevated on pillows while symptoms persist. Try to stop smoking while symptoms persist. Symptoms should begin to improve over the next 5 to 7 days.  If symptoms fail to improve, or begin to worsen, you may follow-up in this clinic or with your primary care physician for further evaluation. Follow-up as needed.

## 2024-10-09 NOTE — ED Provider Notes (Signed)
 [facility]    CSN: [id number] Arrival date & time: 10/09/24  1050      History   Chief Complaint No chief complaint on file.   HPI Philip Blake is a 53 y.o. male.   The history is provided by the patient.   Patient presents with a 2-day history of cough and runny nose.  He denies fever, chills, headache, ear pain, wheezing, difficulty breathing, abdominal pain, nausea, vomiting, diarrhea, or rash.  Patient reports that he does have underlying history of smoking.  So far, states he has not taken any medications for his symptoms.  History reviewed. No pertinent past medical history.  Patient Active Problem List   Diagnosis Date Noted   Annual physical exam 06/08/2021   Tobacco abuse 06/08/2021   Prehypertension 06/08/2021    History reviewed. No pertinent surgical history.     Home Medications    Prior to Admission medications  Not on File    Family History History reviewed. No pertinent family history.  Social History Social History[1]   Allergies   Sulfa antibiotics   Review of Systems Review of Systems Per HPI  Physical Exam Triage Vital Signs ED Triage Vitals  Encounter Vitals Group     BP 10/09/24 1222 (!) 140/96     Girls Systolic BP Percentile --      Girls Diastolic BP Percentile --      Boys Systolic BP Percentile --      Boys Diastolic BP Percentile --      Pulse Rate 10/09/24 1222 (!) 114     Resp 10/09/24 1222 20     Temp 10/09/24 1222 98 F (36.7 C)     Temp Source 10/09/24 1222 Oral     SpO2 10/09/24 1222 95 %     Weight --      Height --      Head Circumference --      Peak Flow --      Pain Score 10/09/24 1221 0     Pain Loc --      Pain Education --      Exclude from Growth Chart --    No data found.  Updated Vital Signs BP (!) 140/96 (BP Location: Right Arm)   Pulse (!) 114   Temp 98 F (36.7 C) (Oral)   Resp 20   SpO2 95%   Visual Acuity Right Eye Distance:   Left Eye Distance:   Bilateral  Distance:    Right Eye Near:   Left Eye Near:    Bilateral Near:     Physical Exam Vitals and nursing note reviewed.  Constitutional:      General: He is not in acute distress.    Appearance: Normal appearance. He is well-developed.  HENT:     Head: Normocephalic and atraumatic.     Right Ear: Tympanic membrane, ear canal and external ear normal.     Left Ear: Tympanic membrane, ear canal and external ear normal.     Nose: Rhinorrhea present.     Right Turbinates: Enlarged and swollen.     Left Turbinates: Enlarged and swollen.     Right Sinus: No maxillary sinus tenderness or frontal sinus tenderness.     Left Sinus: No maxillary sinus tenderness or frontal sinus tenderness.     Mouth/Throat:     Lips: Pink.     Mouth: Mucous membranes are moist.     Pharynx: Uvula midline. Postnasal drip present. No pharyngeal swelling, oropharyngeal  exudate, posterior oropharyngeal erythema or uvula swelling.     Comments: Cobblestoning present to posterior oropharynx  Eyes:     Extraocular Movements: Extraocular movements intact.     Conjunctiva/sclera: Conjunctivae normal.     Pupils: Pupils are equal, round, and reactive to light.  Neck:     Thyroid: No thyromegaly.     Trachea: No tracheal deviation.  Cardiovascular:     Rate and Rhythm: Normal rate and regular rhythm.     Pulses: Normal pulses.     Heart sounds: Normal heart sounds.  Pulmonary:     Effort: Pulmonary effort is normal. No respiratory distress.     Breath sounds: Normal breath sounds. No stridor. No wheezing, rhonchi or rales.  Abdominal:     General: Bowel sounds are normal.     Palpations: Abdomen is soft.     Tenderness: There is no abdominal tenderness.  Musculoskeletal:     Cervical back: Normal range of motion and neck supple.  Skin:    General: Skin is warm and dry.  Neurological:     General: No focal deficit present.     Mental Status: He is alert and oriented to person, place, and time.  Psychiatric:         Mood and Affect: Mood normal.        Behavior: Behavior normal.        Thought Content: Thought content normal.        Judgment: Judgment normal.      UC Treatments / Results  Labs (all labs ordered are listed, but only abnormal results are displayed) Labs Reviewed  POC COVID19/FLU A&B COMBO    EKG   Radiology No results found.  Procedures Procedures (including critical care time)  Medications Ordered in UC Medications - No data to display  Initial Impression / Assessment and Plan / UC Course  I have reviewed the triage vital signs and the nursing notes.  Pertinent labs & imaging results that were available during my care of the patient were reviewed by me and considered in my medical decision making (see chart for details).  The COVID/flu test was negative.  On exam, the patient's lung sounds are clear throughout, room air sats are at 95%.  The patient is well-appearing, he is in no acute distress, vital signs are stable.  Patient reports that he is beginning to feel better.  Symptoms are consistent with viral etiology.  Will provide symptomatic treatment with Promethazine  DM for the cough.  Smoking cessation was recommended.  Supportive care recommendations were provided and discussed with the patient to include fluids, rest, over-the-counter analgesics, and use of a humidifier during sleep.  Discussed indications with the patient regarding follow-up.  Patient was in agreement with this plan of care and verbalizes understanding.  All questions were answered.  Patient stable for discharge.   Final Clinical Impressions(s) / UC Diagnoses   Final diagnoses:  None   Discharge Instructions   None    ED Prescriptions   None    PDMP not reviewed this encounter.    [1]  Social History Tobacco Use   Smoking status: Every Day    Types: Cigarettes    Passive exposure: Current   Smokeless tobacco: Never  Substance Use Topics   Alcohol use: Yes    Alcohol/week:  6.0 standard drinks of alcohol    Types: 6 Cans of beer per week    Comment: 6 per day   Drug use: Never  Gilmer Etta PARAS, NP 10/09/24 1315

## 2024-10-09 NOTE — ED Triage Notes (Signed)
 Pt reports he has a cough, body aches, and runny nose x 2 days
